# Patient Record
Sex: Female | Born: 2000 | Race: Black or African American | Hispanic: No | Marital: Single | Smoking: Never smoker
Health system: Southern US, Community
[De-identification: ages and names within clinical notes are randomized; demographics above are authoritative.]

---
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
## 2020-07-02 ENCOUNTER — Emergency Department (HOSPITAL_COMMUNITY)
Admission: EM | Admit: 2020-07-02 | Discharge: 2020-07-03 | Disposition: A | Discharge status: Home / Self Care | Attending: Emergency Medicine | Admitting: Emergency Medicine

## 2020-07-02 ENCOUNTER — Emergency Department (HOSPITAL_COMMUNITY)

## 2020-07-02 ENCOUNTER — Other Ambulatory Visit: Payer: Self-pay

## 2020-07-02 ENCOUNTER — Encounter (HOSPITAL_COMMUNITY): Payer: Self-pay | Admitting: Emergency Medicine

## 2020-07-02 DIAGNOSIS — R42 Dizziness and giddiness: Secondary | ICD-10-CM | POA: Diagnosis not present

## 2020-07-02 DIAGNOSIS — N939 Abnormal uterine and vaginal bleeding, unspecified: Secondary | ICD-10-CM | POA: Diagnosis not present

## 2020-07-02 DIAGNOSIS — R079 Chest pain, unspecified: Secondary | ICD-10-CM | POA: Insufficient documentation

## 2020-07-02 DIAGNOSIS — R55 Syncope and collapse: Secondary | ICD-10-CM | POA: Diagnosis not present

## 2020-07-02 DIAGNOSIS — Z5321 Procedure and treatment not carried out due to patient leaving prior to being seen by health care provider: Secondary | ICD-10-CM | POA: Diagnosis not present

## 2020-07-02 LAB — URINALYSIS, ROUTINE W REFLEX MICROSCOPIC
Bilirubin Urine: NEGATIVE
Glucose, UA: NEGATIVE mg/dL
Hgb urine dipstick: NEGATIVE
Ketones, ur: NEGATIVE mg/dL
Leukocytes,Ua: NEGATIVE
Nitrite: NEGATIVE
Protein, ur: 30 mg/dL — AB
Specific Gravity, Urine: 1.018 (ref 1.005–1.030)
pH: 6 (ref 5.0–8.0)

## 2020-07-02 LAB — BASIC METABOLIC PANEL
Anion gap: 10 (ref 5–15)
BUN: 10 mg/dL (ref 6–20)
CO2: 26 mmol/L (ref 22–32)
Calcium: 9.4 mg/dL (ref 8.9–10.3)
Chloride: 103 mmol/L (ref 98–111)
Creatinine, Ser: 0.83 mg/dL (ref 0.44–1.00)
GFR, Estimated: >60 mL/min (ref >60)
Glucose, Bld: 90 mg/dL (ref 70–99)
Potassium: 4.8 mmol/L (ref 3.5–5.1)
Sodium: 139 mmol/L (ref 135–145)

## 2020-07-02 LAB — TROPONIN I (HIGH SENSITIVITY)
Troponin I (High Sensitivity): <2 ng/L (ref <18)
Troponin I (High Sensitivity): <2 ng/L (ref <18)

## 2020-07-02 LAB — CBC
HCT: 41.7 % (ref 36.0–46.0)
Hemoglobin: 13.2 g/dL (ref 12.0–15.0)
MCH: 29.5 pg (ref 26.0–34.0)
MCHC: 31.7 g/dL (ref 30.0–36.0)
MCV: 93.1 fL (ref 80.0–100.0)
Platelets: 309 10*3/uL (ref 150–400)
RBC: 4.48 MIL/uL (ref 3.87–5.11)
RDW: 11.8 % (ref 11.5–15.5)
WBC: 11.1 10*3/uL — ABNORMAL HIGH (ref 4.0–10.5)
nRBC: 0 % (ref 0.0–0.2)

## 2020-07-02 LAB — I-STAT BETA HCG BLOOD, ED (MC, WL, AP ONLY)
I-stat hCG, quantitative: <5 m[IU]/mL (ref <5)
I-stat hCG, quantitative: <5 m[IU]/mL (ref <5)

## 2020-07-02 NOTE — ED Triage Notes (Signed)
Patient states LMP 10/17 was normal then had her next cycle 11/5 and states having increased vaginal bleeding and changing tampons frequently. States "Blacked out" two days ago while standing and became dizzy. Intermittent chest pain 3 days ago and today. Currently chest pain 2/10 pressure denies shortness of breath.

## 2020-07-02 NOTE — ED Notes (Signed)
Called 2x in past 20 min for vitals w/o response

## 2020-07-02 NOTE — ED Notes (Signed)
Pt seen leaving the waiting room and has not returned. Pt called no answer.

## 2020-09-18 ENCOUNTER — Other Ambulatory Visit: Payer: Self-pay

## 2020-09-19 ENCOUNTER — Encounter: Payer: Self-pay | Admitting: Nurse Practitioner

## 2020-09-19 ENCOUNTER — Ambulatory Visit (INDEPENDENT_AMBULATORY_CARE_PROVIDER_SITE_OTHER): Admitting: Nurse Practitioner

## 2020-09-19 VITALS — BP 100/74 | HR 80 | Temp 97.8°F | Ht 67.0 in | Wt 140.2 lb

## 2020-09-19 DIAGNOSIS — Z3045 Encounter for surveillance of transdermal patch hormonal contraceptive device: Secondary | ICD-10-CM | POA: Diagnosis not present

## 2020-09-19 MED ORDER — XULANE 150-35 MCG/24HR TD PTWK: 1.0000 | MEDICATED_PATCH | TRANSDERMAL | 3 refills | Status: AC

## 2020-09-19 NOTE — Progress Notes (Signed)
   Subjective:  Patient ID: Kayla Ramirez, female    DOB: 2000/09/24  Age: 20 y.o. MRN: [id number]  CC: Establish Care (New patient/Birth control refill needed. 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 She moved from [place]. She is here to establish care and get contreption refills. She is sexually active with use of condoms, but denies the need for STD screen today. No tobacco use.  Reviewed past Medical, Social and Family history today.  Outpatient Medications Prior to Visit  Medication Sig Dispense Refill  . loratadine (CLARITIN) 10 MG tablet Take 10 mg by mouth daily.     No facility-administered medications prior to visit.    ROS See HPI  Objective:  BP 100/74 (BP Location: Left Arm, Patient Position: Sitting, Cuff Size: Normal)   Pulse 80   Temp 97.8 F (36.6 C) (Temporal)   Ht 5\' 7"  (1.702 m)   Wt 140 lb 3.2 oz (63.6 kg)   BMI 21.96 kg/m   Physical Exam Vitals reviewed.  Neck:     Thyroid: No thyroid mass, thyromegaly or thyroid tenderness.  Cardiovascular:     Rate and Rhythm: Normal rate and regular rhythm.     Pulses: Normal pulses.     Heart sounds: Normal heart sounds.  Pulmonary:     Effort: Pulmonary effort is normal.     Breath sounds: Normal breath sounds.  Musculoskeletal:     Cervical back: Normal range of motion and neck supple.  Neurological:     Mental Status: She is alert and oriented to person, place, and time.  Psychiatric:        Mood and Affect: Mood normal.        Behavior: Behavior normal.        Thought Content: Thought content normal.     Assessment & Plan:  This visit occurred during the SARS-CoV-2 public health emergency.  Safety protocols were in place, including screening questions prior to the visit, additional usage of staff PPE, and extensive cleaning of exam room while observing appropriate contact time as indicated for disinfecting solutions.   Kayla Ramirez was seen today for establish  care.  Diagnoses and all orders for this visit:  Encounter for surveillance of transdermal patch hormonal contraceptive device -     norelgestromin-ethinyl estradiol Kayla Ramirez) 150-35 MCG/24HR transdermal patch; Place 1 patch onto the skin once a week. -     POCT urine pregnancy  negative urine pregnancy today  Problem List Items Addressed This Visit   None   Visit Diagnoses    Encounter for surveillance of transdermal patch hormonal contraceptive device    -  Primary   Relevant Medications   norelgestromin-ethinyl estradiol Burr Medico) 150-35 MCG/24HR transdermal patch   Other Relevant Orders   POCT urine pregnancy      Follow-up: Return in about 6 months (around 03/19/2021) for CPE (fasting).  05/19/2021, NP

## 2020-09-22 ENCOUNTER — Encounter: Payer: Self-pay | Admitting: Nurse Practitioner

## 2020-09-22 LAB — POCT URINE PREGNANCY: Preg Test, Ur: NEGATIVE

## 2020-10-01 ENCOUNTER — Telehealth: Payer: Self-pay | Admitting: Nurse Practitioner

## 2020-10-01 NOTE — Telephone Encounter (Signed)
Pt called and mom and said pt was trying to get birth control filled and [facility] said it was blocked by insurance, she said her daughter told her that the pharmacy told her to call her pcp. Pt is almost out

## 2020-10-01 NOTE — Telephone Encounter (Signed)
Disregard last note pt mom called back and said they are just gonna pay out of pocket this time

## 2021-03-21 ENCOUNTER — Encounter: Admitting: Nurse Practitioner

## 2021-03-27 ENCOUNTER — Encounter: Admitting: Nurse Practitioner

## 2022-06-05 ENCOUNTER — Ambulatory Visit: Attending: Cardiovascular Disease

## 2022-06-05 ENCOUNTER — Ambulatory Visit: Attending: Cardiovascular Disease | Admitting: Cardiovascular Disease

## 2022-06-05 ENCOUNTER — Encounter: Payer: Self-pay | Admitting: Cardiovascular Disease

## 2022-06-05 VITALS — BP 100/52 | HR 78 | Ht 67.0 in | Wt 138.4 lb

## 2022-06-05 DIAGNOSIS — I471 Supraventricular tachycardia, unspecified: Secondary | ICD-10-CM | POA: Diagnosis not present

## 2022-06-05 DIAGNOSIS — E611 Iron deficiency: Secondary | ICD-10-CM

## 2022-06-05 DIAGNOSIS — R55 Syncope and collapse: Secondary | ICD-10-CM | POA: Diagnosis not present

## 2022-06-05 DIAGNOSIS — N920 Excessive and frequent menstruation with regular cycle: Secondary | ICD-10-CM | POA: Diagnosis not present

## 2022-06-05 NOTE — Progress Notes (Unsigned)
Enrolled for [employer] to mail a ZIO XT long term holter monitor to the patients address on file.  

## 2022-06-05 NOTE — Patient Instructions (Signed)
Medication Instructions:  Recommend over the counter iron supplement  *If you need a refill on your cardiac medications before your next appointment, please call your pharmacy*  Testing/Procedures: Your physician has requested that you have an echocardiogram. Echocardiography is a painless test that uses sound waves to create images of your heart. It provides your doctor with information about the size and shape of your heart and how well your heart's chambers and valves are working. This procedure takes approximately one hour. There are no restrictions for this procedure. Please do NOT wear cologne, perfume, aftershave, or lotions (deodorant is allowed). Please arrive 15 minutes prior to your appointment time.  ZIO XT- Long Term Monitor Instructions   Your physician has requested you wear a ZIO patch monitor for _14_ days.  This is a single patch monitor.   [employer] supplies one patch monitor per enrollment. Additional stickers are not available. Please do not apply patch if you will be having a Nuclear Stress Test, Echocardiogram, Cardiac CT, MRI, or Chest Xray during the period you would be wearing the monitor. The patch cannot be worn during these tests. You cannot remove and re-apply the ZIO XT patch monitor.  Your ZIO patch monitor will be sent Fed Ex from [employer] directly to your home address. It may take 3-5 days to receive your monitor after you have been enrolled.  Once you have received your monitor, please review the enclosed instructions. Your monitor has already been registered assigning a specific monitor serial # to you.  Billing and Patient Assistance Program Information   We have supplied [employer] with any of your insurance information on file for billing purposes. [employer] offers a sliding scale Patient Assistance Program for patients that do not have insurance, or whose insurance does not completely cover the cost of the ZIO monitor.   You must apply for the Patient  Assistance Program to qualify for this discounted rate.     To apply, please call [employer] at [phone], select option 4, then select option 2, and ask to apply for Patient Assistance Program.  Theodore Demark will ask your household income, and how many people are in your household.  They will quote your out-of-pocket cost based on that information.  [employer] will also be able to set up a 70-month, interest-free payment plan if needed.  Applying the monitor   Shave hair from upper left chest.  Hold abrader disc by orange tab. Rub abrader in 40 strokes over the upper left chest as indicated in your monitor instructions.  Clean area with 4 enclosed alcohol pads. Let dry.  Apply patch as indicated in monitor instructions. Patch will be placed under collarbone on left side of chest with arrow pointing upward.  Rub patch adhesive wings for 2 minutes. Remove white label marked "1". Remove the white label marked "2". Rub patch adhesive wings for 2 additional minutes.  While looking in a mirror, press and release button in center of patch. A small green light will flash 3-4 times. This will be your only indicator that the monitor has been turned on. ?  Do not shower for the first 24 hours. You may shower after the first 24 hours.  Press the button if you feel a symptom. You will hear a small click. Record Date, Time and Symptom in the Patient Logbook.  When you are ready to remove the patch, follow instructions on the last 2 pages of the Patient Logbook. Stick patch monitor onto the last page of Patient Logbook.  Place Patient Logbook in the blue and white box.  Use locking tab on box and tape box closed securely.  The blue and white box has prepaid postage on it. Please place it in the mailbox as soon as possible. Your physician should have your test results approximately 7 days after the monitor has been mailed back to [facility].  Call [facility] Customer Care at [phone] if you have questions  regarding your ZIO XT patch monitor. Call them immediately if you see an orange light blinking on your monitor.  If your monitor falls off in less than 4 days, contact our Monitor department at [phone]. ?If your monitor becomes loose or falls off after 4 days call [employer] at [phone] for suggestions on securing your monitor.?  Follow-Up: At [facility], you and your health needs are our priority.  As part of our continuing mission to provide you with exceptional heart care, we have created designated Provider Care Teams.  These Care Teams include your primary Cardiologist (physician) and Advanced Practice Providers (APPs -  Physician Assistants and Nurse Practitioners) who all work together to provide you with the care you need, when you need it.  We recommend signing up for the patient portal called "MyChart".  Sign up information is provided on this After Visit Summary.  MyChart is used to connect with patients for Virtual Visits (Telemedicine).  Patients are able to view lab/test results, encounter notes, upcoming appointments, etc.  Non-urgent messages can be sent to your provider as well.   To learn more about what you can do with MyChart, go to [email or web address].    Your next appointment:   2-3 month(s)  The format for your next appointment:   In Person  Provider:   Dr. Tresa Endo

## 2022-06-05 NOTE — Progress Notes (Signed)
Cardiology Office Note    Date:  06/09/2022   ID:  Kayla Ramirez, DOB 2001-02-19, MRN [id number]  PCP:  Anne Ng, NP  Cardiologist:  Nicki Guadalajara, MD   New cariology consult referred by [employer] for evaluation of syncope.   History of Present Illness:  Kayla Ramirez is a 21 y.o. 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  She has previously documented to have a history of supraventricular tachycardia when she was 46 or 21 years old.  Apparently, on October 15, 2020 she underwent SVT ablation at [facility] since her father had been in the [employer].  Apparently the patient had experienced a syncopal episode prior to her SVT ablation but she admits that she has had 2 other instances where she passed out after her ablation.  She has had approximately 5 other episodes where she would get a sensation of dizziness, transiently cannot see but denied any associated syncope.  Most recently on May 26, 2022 she was sitting down and began to notice her head becoming heavy, dizziness, and apparently she was told that she passed out for 30 seconds.  She had eaten that day.  She was evaluated at [facility] health center at [facility] on May 27, 2022.  At that time, her vital signs were stable with blood pressure 122/80 respiratory rate 16 and pulse 97.  O2 sat was 98%.  Laboratory revealed hemoglobin 14.2 hematocrit 43.6.  MCV 88.  Chemistry was normal with potassium 4.0.  BUN 9 creatinine 0.9.  Iron saturation was low at 11% with ferritin 18.  She admits to heavy menses.  She is now referred for cardiology evaluation.  She denies any chest pain.  He denies any exertional dyspnea.  Medical/surgical history notable for SVT ablation October 15, 2020 at [facility].  Current Medications: Outpatient Medications Prior to Visit  Medication Sig Dispense Refill    loratadine (CLARITIN) 10 MG tablet Take 10 mg by mouth daily.     norelgestromin-ethinyl estradiol [facility]) 150-35 MCG/24HR transdermal patch Place 1 patch onto the skin once a week. 12 patch 3   No facility-administered medications prior to visit.     Allergies:   Patient has no known allergies.   Social History   Socioeconomic History   Marital status: Single    Spouse name: Not on file   Number of children: Not on file   Years of education: Not on file   Highest education level: Not on file  Occupational History   Not on file  Tobacco Use   Smoking status: Never   Smokeless tobacco: Never  Vaping Use   Vaping Use: Never used  Substance and Sexual Activity   Alcohol use: Never   Drug use: Never   Sexual activity: Yes    Birth control/protection: Patch  Other Topics Concern   Not on file  Social History Narrative   Not on file   Social Determinants of Health   Financial Resource Strain: Not on file  Food Insecurity: Not on file  Transportation Needs: Not on file  Physical Activity: Not on file  Stress: Not on file  Social Connections: Not on file    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  She does not use cigarettes.  She does very  rare use of marijuana and does drink occasional tequila.  She does try to exercise at least 3 days/week for up to an hour with walking, and stair climbing.  Family History: Parents are living.  Mother is 3.  Father is 65 and served in [employer] for 25 years.  She has 2 brothers who are twins age 45.  ROS General: Negative; No fevers, chills, or night sweats;  HEENT: Negative; No changes in vision or hearing, sinus congestion, difficulty swallowing Pulmonary: Negative; No cough, wheezing, shortness of breath, hemoptysis Cardiovascular: See HPI GI: Negative; No nausea, vomiting, diarrhea, or abdominal pain GU: Negative; No dysuria, hematuria,  or difficulty voiding Musculoskeletal: Negative; no myalgias, joint pain, or weakness Hematologic/Oncology: Negative; no easy bruising, bleeding Endocrine: Negative; no heat/cold intolerance; no diabetes Neuro: Negative; no changes in balance, headaches Skin: Multiple tattoos Psychiatric: Negative; No behavioral problems, depression Sleep: Negative; No snoring, daytime sleepiness, hypersomnolence, bruxism, restless legs, hypnogognic hallucinations, no cataplexy Other comprehensive 14 point system review is negative.   PHYSICAL EXAM:   VS:  BP (!) 100/52   Pulse 78   Ht 5\' 7"  (1.702 m)   Wt 138 lb 6.4 oz (62.8 kg)   SpO2 99%   BMI 21.68 kg/m    Repeat blood pressure by me was 104/60 supine 106/60 sitting and 100/60 standing.   Wt Readings from Last 3 Encounters:  06/05/22 138 lb 6.4 oz (62.8 kg)  09/19/20 140 lb 3.2 oz (63.6 kg) (70 %, Z= 0.53)*  07/02/20 137 lb (62.1 kg) (67 %, Z= 0.43)*   * Growth percentiles are based on CDC (Girls, 2-20 Years) data.    General: Alert, oriented, no distress.  Skin: normal turgor, no rashes, warm and dry; multiple tattoos over her body HEENT: Normocephalic, atraumatic. Pupils equal round and reactive to light; sclera anicteric; extraocular muscles intact;  Nose without nasal septal hypertrophy Mouth/Parynx benign; Mallinpatti scale 2 Neck: No JVD, no carotid bruits; normal carotid upstroke Lungs: clear to ausculatation and percussion; no wheezing or rales Chest wall: Mild pectus excavatum; without tenderness to palpitation Heart: PMI not displaced, RRR, s1 s2 normal, 1/6 systolic murmur, no diastolic murmur, no rubs, gallops, thrills, or heaves Abdomen: soft, nontender; no hepatosplenomehaly, BS+; abdominal aorta nontender and not dilated by palpation. Back: no CVA tenderness Pulses 2+ Musculoskeletal: full range of motion, normal strength, no joint deformities Extremities: no clubbing cyanosis or edema, Homan's sign negative  Neurologic:  grossly nonfocal; Cranial nerves grossly wnl Psychologic: Normal mood and affect   Studies/Labs Reviewed:   June 05, 2022 ECG (independently read by me): NSR at 78; nonspecific T wave abnormality; PR 140 msec; QTc 440 msec  Recent Labs:    Latest Ref Rng & Units 07/02/2020    2:15 PM  BMP  Glucose 70 - 99 mg/dL 90   BUN 6 - 20 mg/dL 10   Creatinine 07/04/2020 - 1.00 mg/dL 4.13   Sodium 2.44 - 010 mmol/L 139   Potassium 3.5 - 5.1 mmol/L 4.8   Chloride 98 - 111 mmol/L 103   CO2 22 - 32 mmol/L 26   Calcium 8.9 - 10.3 mg/dL 9.4          No data to display             Latest Ref Rng & Units 07/02/2020    2:15 PM  CBC  WBC 4.0 - 10.5 K/uL 11.1   Hemoglobin 12.0 - 15.0 g/dL 07/04/2020   Hematocrit 53.6 - 46.0 % 41.7  Platelets 150 - 400 K/uL 309    Lab Results  Component Value Date   MCV 93.1 07/02/2020   No results found for: "TSH" No results found for: "HGBA1C"   BNP No results found for: "BNP"  ProBNP No results found for: "PROBNP"   Lipid Panel  No results found for: "CHOL", "TRIG", "HDL", "CHOLHDL", "VLDL", "LDLCALC", "LDLDIRECT", "LABVLDL"   RADIOLOGY: No results found.   Additional studies/ records that were reviewed today include:  Reviewed the medical records from [facility]  ASSESSMENT:    1. Syncope, unspecified syncope type   2. SVT (supraventricular tachycardia): Status post ablation on October 15, 2020 at [facility]   3. Iron deficiency   4. Menorrhagia with regular cycle    PLAN:  Ms. Zanylah Hardie is a very pleasant 21 year old marked female who is currently attending [employer] [employer]  She has a history of SVT which initially occurred when she was 18 or 19.  She had experienced a syncopal spell and ultimately underwent SVT ablation on October 15, 2020 at [facility] in [place].  Subsequently, she admits to passing out at least 2  additional times and has experienced several episodes where she becomes dizzy, briefly cannot see but has not lost consciousness.  On May 26, 2022 while sitting down and unaware that her heart was racing she felt her head become very heavy she then became dizzy and apparently passed out for 30 seconds.  She typically walks on campus fair amount and does not have issues with exertion.  She denies any chest pain.  I reviewed her recent laboratory done at the [facility].  On exam there is a 1/6 systolic ejection murmur in the lower sternal border.  Presently, I am scheduling her for 2D echo Doppler evaluation to assess systolic and diastolic function.  I am also scheduling her for 2-week Zio patch monitor.  I reviewed her laboratory.  She is iron deficient most likely resulting from heavy menses and have suggested over-the-counter iron supplementation.  Ferritin is low at 18 with percent saturation at 11%.  If iron deficiency does not improve she may benefit from Niferex.  She is not on any significant medications except uses a Xulane transdermal patch for birth control.  She takes Claritin.  I will contact her following the results of her study and plan to see her in 2 to 3 months for reevaluation.   Medication Adjustments/Labs and Tests Ordered: Current medicines are reviewed at length with the patient today.  Concerns regarding medicines are outlined above.  Medication changes, Labs and Tests ordered today are listed in the Patient Instructions below. Patient Instructions  Medication Instructions:  Recommend over the counter iron supplement  *If you need a refill on your cardiac medications before your next appointment, please call your pharmacy*  Testing/Procedures: Your physician has requested that you have an echocardiogram. Echocardiography is a painless test that uses sound waves to create images of your heart. It provides your doctor with information about the size and shape of  your heart and how well your heart's chambers and valves are working. This procedure takes approximately one hour. There are no restrictions for this procedure. Please do NOT wear cologne, perfume, aftershave, or lotions (deodorant is allowed). Please arrive 15 minutes prior to your appointment time.  ZIO XT- Long Term Monitor Instructions   Your physician has requested you wear a  ZIO patch monitor for _14_ days.  This is a single patch monitor.   [employer] supplies one patch monitor per enrollment. Additional stickers are not available. Please do not apply patch if you will be having a Nuclear Stress Test, Echocardiogram, Cardiac CT, MRI, or Chest Xray during the period you would be wearing the monitor. The patch cannot be worn during these tests. You cannot remove and re-apply the ZIO XT patch monitor.  Your ZIO patch monitor will be sent Fed Ex from [employer] directly to your home address. It may take 3-5 days to receive your monitor after you have been enrolled.  Once you have received your monitor, please review the enclosed instructions. Your monitor has already been registered assigning a specific monitor serial # to you.  Billing and Patient Assistance Program Information   We have supplied [employer] with any of your insurance information on file for billing purposes. [employer] offers a sliding scale Patient Assistance Program for patients that do not have insurance, or whose insurance does not completely cover the cost of the ZIO monitor.   You must apply for the Patient Assistance Program to qualify for this discounted rate.     To apply, please call [employer] at [phone], select option 4, then select option 2, and ask to apply for Patient Assistance Program.  [employer] will ask your household income, and how many people are in your household.  They will quote your out-of-pocket cost based on that information.  [employer] will also be able to set up a 7213-month, interest-free payment plan if  needed.  Applying the monitor   Shave hair from upper left chest.  Hold abrader disc by orange tab. Rub abrader in 40 strokes over the upper left chest as indicated in your monitor instructions.  Clean area with 4 enclosed alcohol pads. Let dry.  Apply patch as indicated in monitor instructions. Patch will be placed under collarbone on left side of chest with arrow pointing upward.  Rub patch adhesive wings for 2 minutes. Remove white label marked "1". Remove the white label marked "2". Rub patch adhesive wings for 2 additional minutes.  While looking in a mirror, press and release button in center of patch. A small green light will flash 3-4 times. This will be your only indicator that the monitor has been turned on. ?  Do not shower for the first 24 hours. You may shower after the first 24 hours.  Press the button if you feel a symptom. You will hear a small click. Record Date, Time and Symptom in the Patient Logbook.  When you are ready to remove the patch, follow instructions on the last 2 pages of the Patient Logbook. Stick patch monitor onto the last page of Patient Logbook.  Place Patient Logbook in the blue and white box.  Use locking tab on box and tape box closed securely.  The blue and white box has prepaid postage on it. Please place it in the mailbox as soon as possible. Your physician should have your test results approximately 7 days after the monitor has been mailed back to [facility].  Call [facility] Customer Care at [phone] if you have questions regarding your ZIO XT patch monitor. Call them immediately if you see an orange light blinking on your monitor.  If your monitor falls off in less than 4 days, contact our Monitor department at [phone]. ?If your monitor becomes loose or falls off after 4 days call [employer] at [phone] for suggestions on securing  your monitor.?  Follow-Up: At [facility], you and your health needs are our priority.  As  part of our continuing mission to provide you with exceptional heart care, we have created designated Provider Care Teams.  These Care Teams include your primary Cardiologist (physician) and Advanced Practice Providers (APPs -  Physician Assistants and Nurse Practitioners) who all work together to provide you with the care you need, when you need it.  We recommend signing up for the patient portal called "MyChart".  Sign up information is provided on this After Visit Summary.  MyChart is used to connect with patients for Virtual Visits (Telemedicine).  Patients are able to view lab/test results, encounter notes, upcoming appointments, etc.  Non-urgent messages can be sent to your provider as well.   To learn more about what you can do with MyChart, go to [email or web address].    Your next appointment:   2-3 month(s)  The format for your next appointment:   In Person  Provider:   Dr. Claiborne Billings          Signed, Shelva Majestic, MD  06/09/2022 5:56 PM    [place] [address], [place], [place], [place]  [address] Phone: [phone]

## 2022-06-09 ENCOUNTER — Encounter: Payer: Self-pay | Admitting: Cardiovascular Disease

## 2022-06-11 ENCOUNTER — Ambulatory Visit (HOSPITAL_COMMUNITY): Attending: Cardiovascular Disease

## 2022-06-11 DIAGNOSIS — R55 Syncope and collapse: Secondary | ICD-10-CM | POA: Insufficient documentation

## 2022-06-11 LAB — ECHOCARDIOGRAM COMPLETE
Area-P 1/2: 4.89 cm2
S' Lateral: 2.8 cm

## 2022-06-20 IMAGING — DX DG CHEST 2V
2 series · 2 of 2 positions shown · non-contrast
Comparison: None.

CLINICAL DATA: Chest pain beginning 3 days ago.  History of SVT.

EXAM:
CHEST - 2 VIEW

[chest pa]
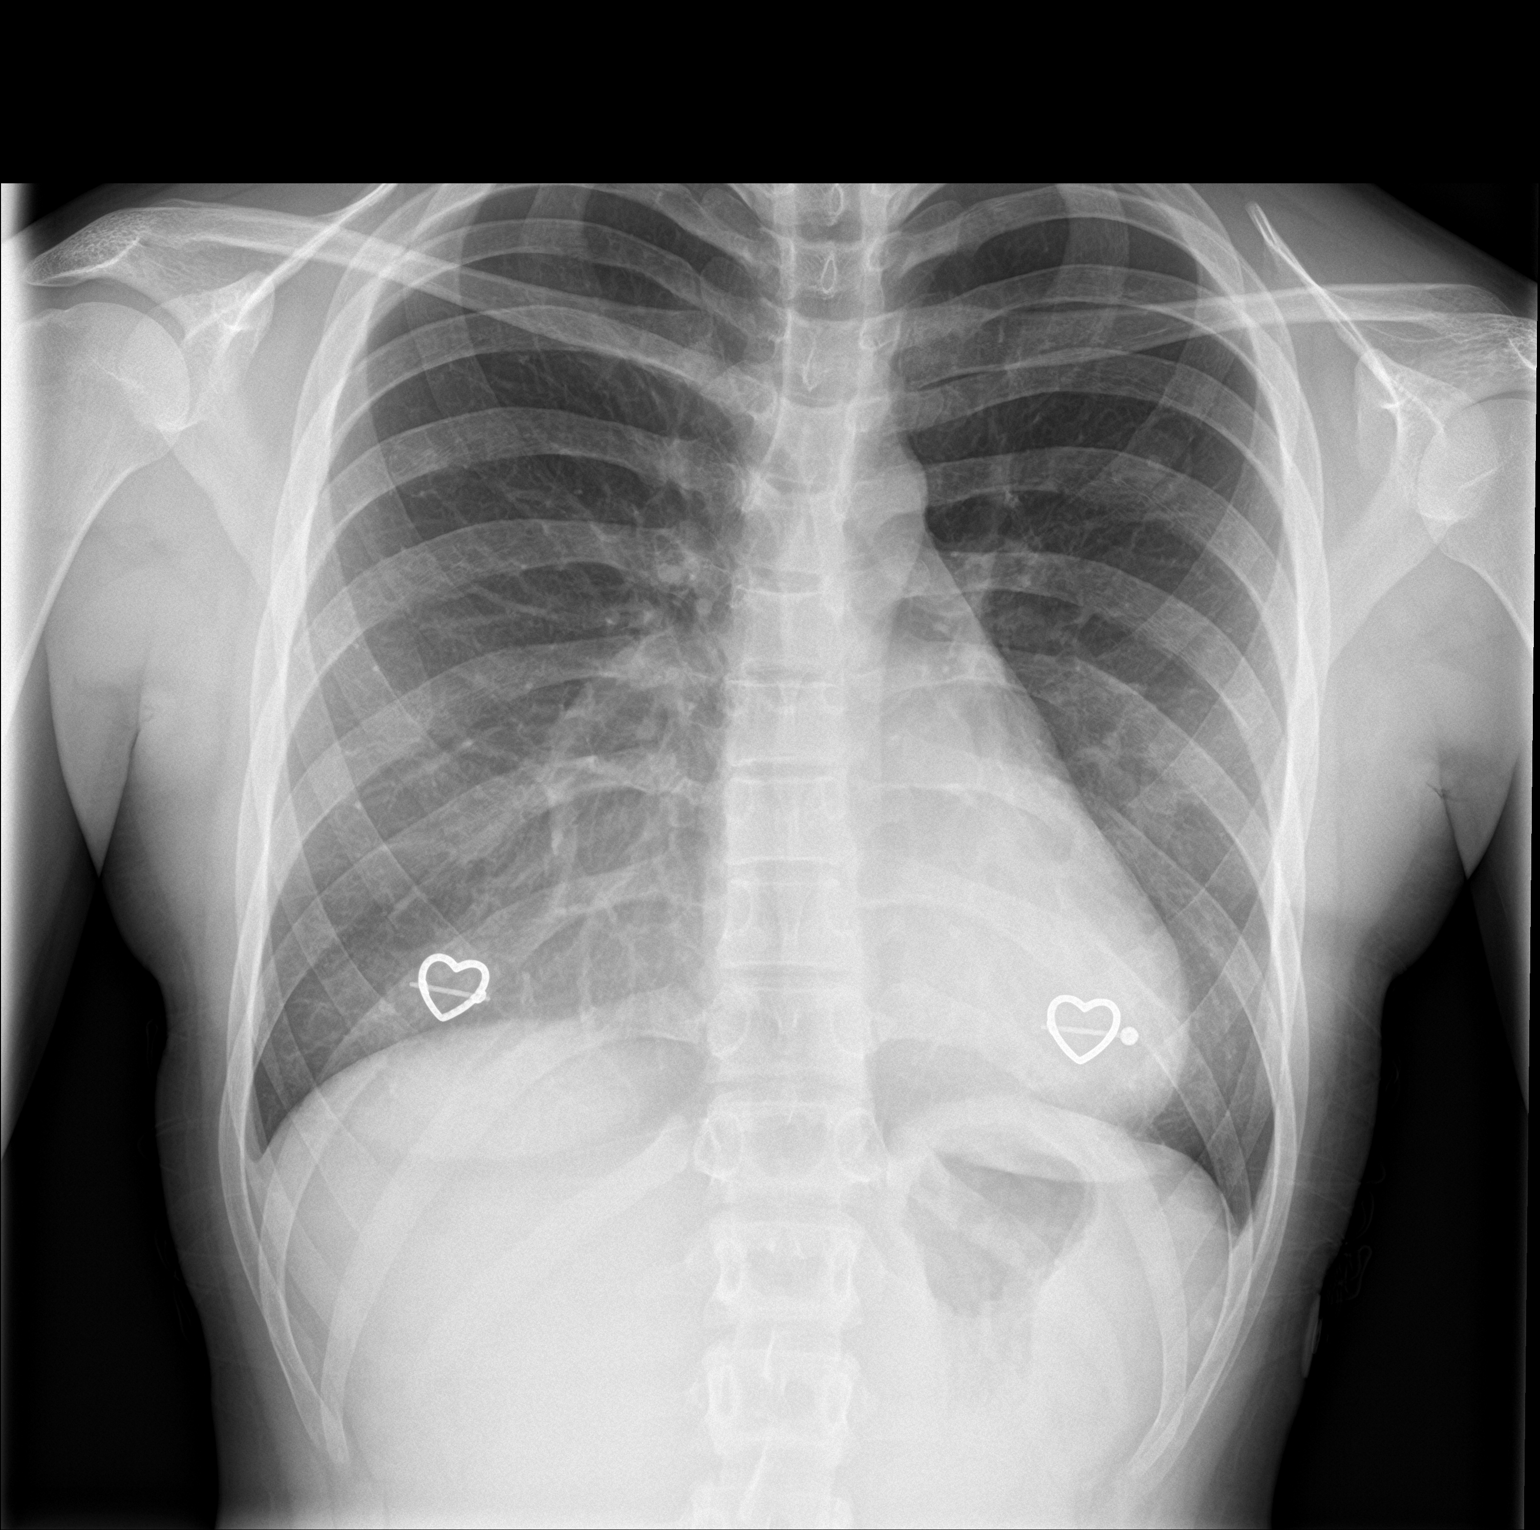

[chest lat]
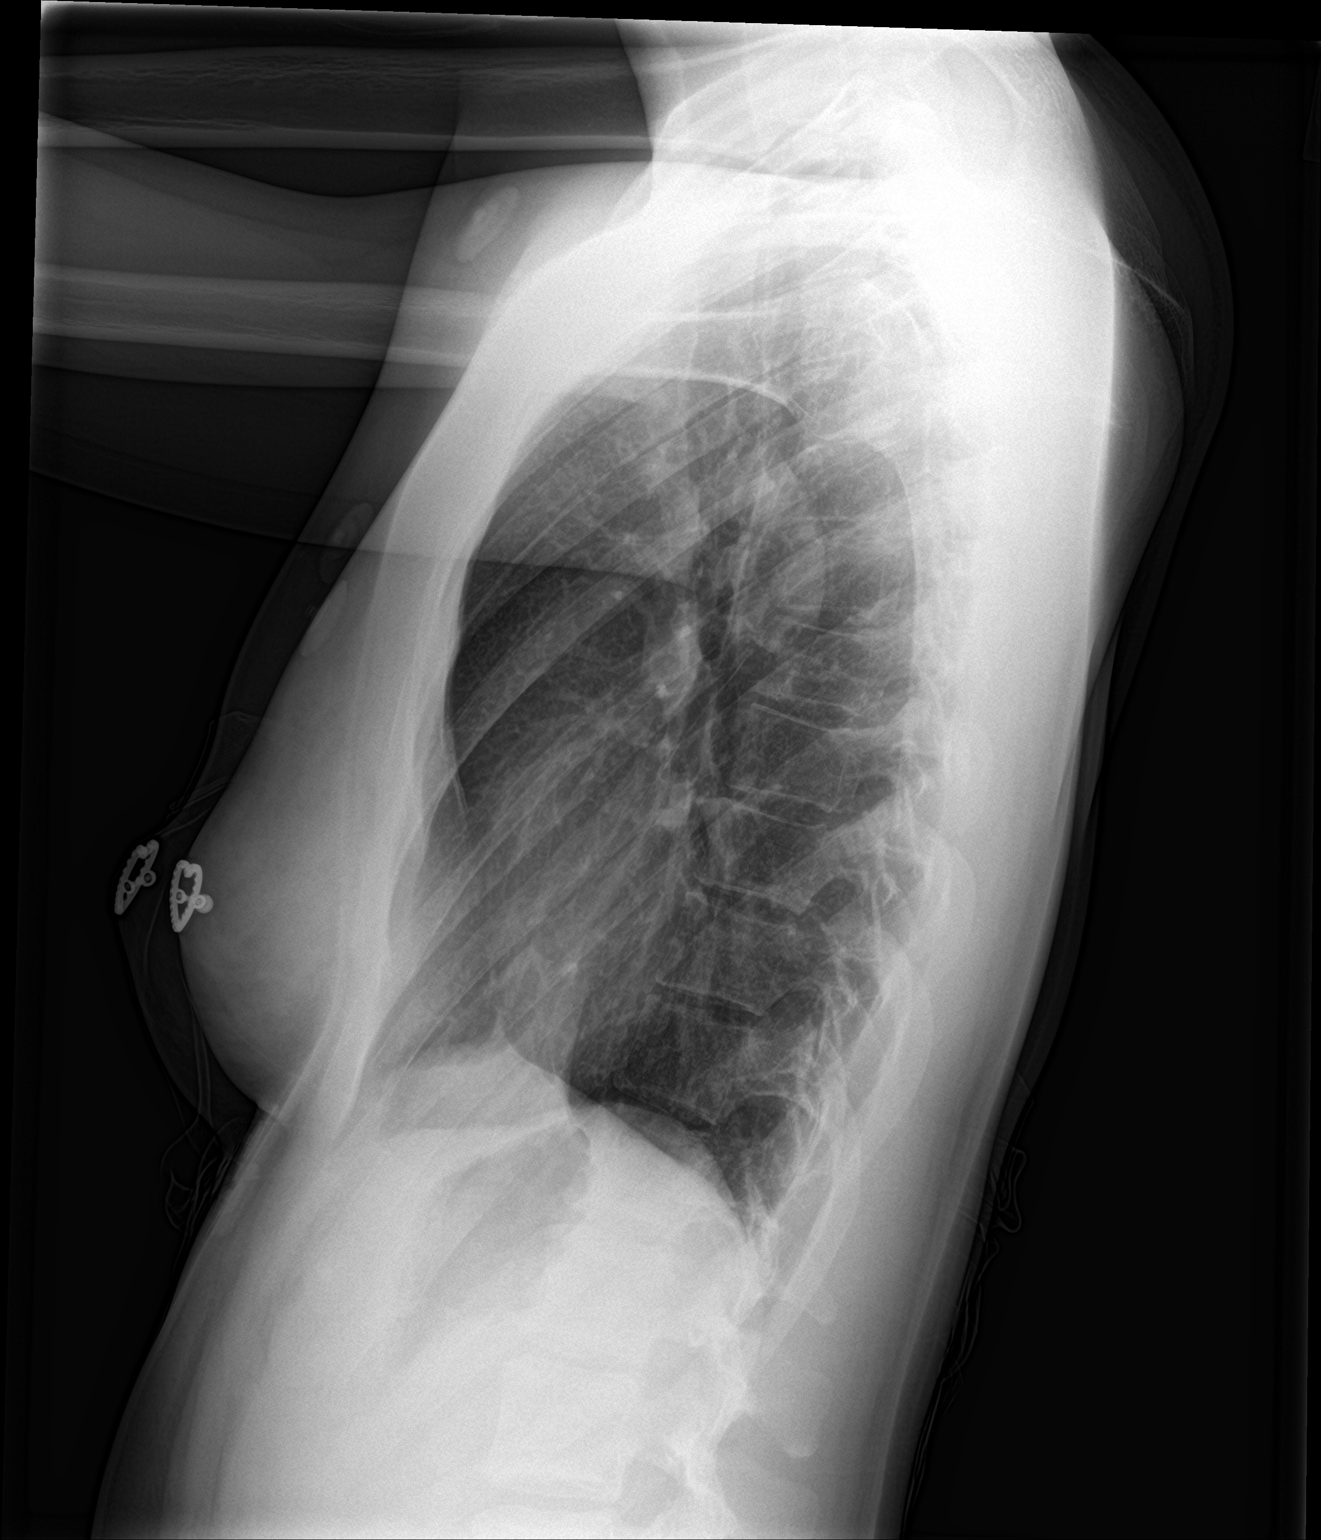

[2 of 2 positions shown; findings below may reference images not displayed]

FINDINGS: Heart size is normal. Mediastinal shadows are normal. The lungs are
clear. No bronchial thickening. No infiltrate, mass, effusion or
collapse. Pulmonary vascularity is normal. No bony abnormality.
IMPRESSION: Normal chest.

## 2022-08-22 NOTE — Progress Notes (Deleted)
Cardiology Office Note    Date:  08/22/2022   ID:  Kayla Ramirez, DOB 04/04/2001, MRN [id number]  PCP:  Anne Ng, NP  Cardiologist:  Nicki Guadalajara, MD   New cariology consult referred by [facility] for evaluation of syncope.   History of Present Illness:  Kayla Ramirez is a 22 y.o. 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  She has previously documented to have a history of supraventricular tachycardia when she was 89 or 22 years old.  Apparently, on October 15, 2020 she underwent SVT ablation at [facility] since her father had been in the [employer].  Apparently the patient had experienced a syncopal episode prior to her SVT ablation but she admits that she has had 2 other instances where she passed out after her ablation.  She has had approximately 5 other episodes where she would get a sensation of dizziness, transiently cannot see but denied any associated syncope.  Most recently on May 26, 2022 she was sitting down and began to notice her head becoming heavy, dizziness, and apparently she was told that she passed out for 30 seconds.  She had eaten that day.  She was evaluated at [facility] on May 27, 2022.  At that time, her vital signs were stable with blood pressure 122/80 respiratory rate 16 and pulse 97.  O2 sat was 98%.  Laboratory revealed hemoglobin 14.2 hematocrit 43.6.  MCV 88.  Chemistry was normal with potassium 4.0.  BUN 9 creatinine 0.9.  Iron saturation was low at 11% with ferritin 18.  She admits to heavy menses.  She is now referred for cardiology evaluation.  She denies any chest pain.  He denies any exertional dyspnea.  Medical/surgical history notable for SVT ablation October 15, 2020 at [facility].  Current Medications: Outpatient Medications Prior to Visit  Medication Sig Dispense Refill    loratadine (CLARITIN) 10 MG tablet Take 10 mg by mouth daily.     norelgestromin-ethinyl estradiol [facility]ico) 150-35 MCG/24HR transdermal patch Place 1 patch onto the skin once a week. 12 patch 3   No facility-administered medications prior to visit.     Allergies:   Patient has no known allergies.   Social History   Socioeconomic History   Marital status: Single    Spouse name: Not on file   Number of children: Not on file   Years of education: Not on file   Highest education level: Not on file  Occupational History   Not on file  Tobacco Use   Smoking status: Never   Smokeless tobacco: Never  Vaping Use   Vaping Use: Never used  Substance and Sexual Activity   Alcohol use: Never   Drug use: Never   Sexual activity: Yes    Birth control/protection: Patch  Other Topics Concern   Not on file  Social History Narrative   Not on file   Social Determinants of Health   Financial Resource Strain: Not on file  Food Insecurity: Not on file  Transportation Needs: Not on file  Physical Activity: Not on file  Stress: Not on file  Social Connections: Not on file    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  She does not use cigarettes.  She does very  rare use of marijuana and does drink occasional tequila.  She does try to exercise at least 3 days/week for up to an hour with walking, and stair climbing.  Family History: Parents are living.  Mother is 54.  Father is 12 and served in [employer] for 25 years.  She has 2 brothers who are twins age 38.  ROS General: Negative; No fevers, chills, or night sweats;  HEENT: Negative; No changes in vision or hearing, sinus congestion, difficulty swallowing Pulmonary: Negative; No cough, wheezing, shortness of breath, hemoptysis Cardiovascular: See HPI GI: Negative; No nausea, vomiting, diarrhea, or abdominal pain GU: Negative; No dysuria, hematuria,  or difficulty voiding Musculoskeletal: Negative; no myalgias, joint pain, or weakness Hematologic/Oncology: Negative; no easy bruising, bleeding Endocrine: Negative; no heat/cold intolerance; no diabetes Neuro: Negative; no changes in balance, headaches Skin: Multiple tattoos Psychiatric: Negative; No behavioral problems, depression Sleep: Negative; No snoring, daytime sleepiness, hypersomnolence, bruxism, restless legs, hypnogognic hallucinations, no cataplexy Other comprehensive 14 point system review is negative.   PHYSICAL EXAM:   VS:  There were no vitals taken for this visit.   Repeat blood pressure by me was 104/60 supine 106/60 sitting and 100/60 standing.   Wt Readings from Last 3 Encounters:  06/05/22 138 lb 6.4 oz (62.8 kg)  09/19/20 140 lb 3.2 oz (63.6 kg) (70 %, Z= 0.53)*  07/02/20 137 lb (62.1 kg) (67 %, Z= 0.43)*   * Growth percentiles are based on CDC (Girls, 2-20 Years) data.    General: Alert, oriented, no distress.  Skin: normal turgor, no rashes, warm and dry; multiple tattoos over her body HEENT: Normocephalic, atraumatic. Pupils equal round and reactive to light; sclera anicteric; extraocular muscles intact;  Nose without nasal septal hypertrophy Mouth/Parynx benign; Mallinpatti scale 2 Neck: No JVD, no carotid bruits; normal carotid upstroke Lungs: clear to ausculatation and percussion; no wheezing or rales Chest wall: Mild pectus excavatum; without tenderness to palpitation Heart: PMI not displaced, RRR, s1 s2 normal, 1/6 systolic murmur, no diastolic murmur, no rubs, gallops, thrills, or heaves Abdomen: soft, nontender; no hepatosplenomehaly, BS+; abdominal aorta nontender and not dilated by palpation. Back: no CVA tenderness Pulses 2+ Musculoskeletal: full range of motion, normal strength, no joint deformities Extremities: no clubbing cyanosis or edema, Homan's sign negative  Neurologic: grossly nonfocal; Cranial nerves grossly wnl Psychologic: Normal  mood and affect   Studies/Labs Reviewed:   June 05, 2022 ECG (independently read by me): NSR at 78; nonspecific T wave abnormality; PR 140 msec; QTc 440 msec  Recent Labs:    Latest Ref Rng & Units 07/02/2020    2:15 PM  BMP  Glucose 70 - 99 mg/dL 90   BUN 6 - 20 mg/dL 10   Creatinine 0.44 - 1.00 mg/dL 0.83   Sodium 135 - 145 mmol/L 139   Potassium 3.5 - 5.1 mmol/L 4.8   Chloride 98 - 111 mmol/L 103   CO2 22 - 32 mmol/L 26   Calcium 8.9 - 10.3 mg/dL 9.4          No data to display             Latest Ref Rng & Units 07/02/2020    2:15 PM  CBC  WBC 4.0 - 10.5 K/uL 11.1   Hemoglobin 12.0 - 15.0 g/dL 13.2   Hematocrit 36.0 - 46.0 % 41.7   Platelets 150 - 400 K/uL 309    Lab Results  Component Value Date   MCV 93.1 07/02/2020   No  results found for: "TSH" No results found for: "HGBA1C"   BNP No results found for: "BNP"  ProBNP No results found for: "PROBNP"   Lipid Panel  No results found for: "CHOL", "TRIG", "HDL", "CHOLHDL", "VLDL", "LDLCALC", "LDLDIRECT", "LABVLDL"   RADIOLOGY: No results found.   Additional studies/ records that were reviewed today include:  Reviewed the medical records from [facility]:    No diagnosis found.  PLAN:  Ms. Tiffine Henigan is a very pleasant 22 year old marked female who is currently attending [employer].  She has a history of SVT which initially occurred when she was 18 or 19.  She had experienced a syncopal spell and ultimately underwent SVT ablation on October 15, 2020 at [facility] in [place].  Subsequently, she admits to passing out at least 2 additional times and has experienced several episodes where she becomes dizzy, briefly cannot see but has not lost consciousness.  On May 26, 2022 while sitting down and unaware that her heart was racing she felt her head become very heavy she then became  dizzy and apparently passed out for 30 seconds.  She typically walks on campus fair amount and does not have issues with exertion.  She denies any chest pain.  I reviewed her recent laboratory done at the [facility].  On exam there is a 1/6 systolic ejection murmur in the lower sternal border.  Presently, I am scheduling her for 2D echo Doppler evaluation to assess systolic and diastolic function.  I am also scheduling her for 2-week Zio patch monitor.  I reviewed her laboratory.  She is iron deficient most likely resulting from heavy menses and have suggested over-the-counter iron supplementation.  Ferritin is low at 18 with percent saturation at 11%.  If iron deficiency does not improve she may benefit from Niferex.  She is not on any significant medications except uses a Xulane transdermal patch for birth control.  She takes Claritin.  I will contact her following the results of her study and plan to see her in 2 to 3 months for reevaluation.   Medication Adjustments/Labs and Tests Ordered: Current medicines are reviewed at length with the patient today.  Concerns regarding medicines are outlined above.  Medication changes, Labs and Tests ordered today are listed in the Patient Instructions below. There are no Patient Instructions on file for this visit.   Signed, Shelva Majestic, MD  08/22/2022 2:08 PM    [facility] [address] [place], [place], [place]  [address] Phone: [phone]

## 2022-08-25 ENCOUNTER — Ambulatory Visit: Attending: Cardiovascular Disease | Admitting: Cardiovascular Disease

## 2023-06-17 ENCOUNTER — Telehealth: Payer: Self-pay | Admitting: Nurse Practitioner

## 2023-06-17 NOTE — Telephone Encounter (Signed)
Lvmtcb to schedule an appt
